# Patient Record
Sex: Male | Born: 1965 | Race: White | Hispanic: No | Marital: Single | Smoking: Current every day smoker
Health system: Southern US, Community
[De-identification: ages and names within clinical notes are randomized; demographics above are authoritative.]

---

## 1998-08-21 ENCOUNTER — Ambulatory Visit (HOSPITAL_COMMUNITY): Admission: RE | Admit: 1998-08-21 | Discharge: 1998-08-21 | Payer: Self-pay | Admitting: Psychiatry

## 1999-08-04 ENCOUNTER — Encounter: Payer: Self-pay | Admitting: Emergency Medicine

## 1999-08-04 ENCOUNTER — Emergency Department (HOSPITAL_COMMUNITY): Admission: EM | Admit: 1999-08-04 | Discharge: 1999-08-04 | Payer: Self-pay

## 2017-04-18 ENCOUNTER — Emergency Department (HOSPITAL_COMMUNITY): Payer: Self-pay

## 2017-04-18 ENCOUNTER — Emergency Department (HOSPITAL_COMMUNITY)
Admission: EM | Admit: 2017-04-18 | Discharge: 2017-04-18 | Disposition: A | Discharge status: Home / Self Care | Payer: Self-pay | Attending: Emergency Medicine | Admitting: Emergency Medicine

## 2017-04-18 ENCOUNTER — Encounter (HOSPITAL_COMMUNITY): Payer: Self-pay

## 2017-04-18 DIAGNOSIS — F10929 Alcohol use, unspecified with intoxication, unspecified: Secondary | ICD-10-CM | POA: Insufficient documentation

## 2017-04-18 DIAGNOSIS — Y999 Unspecified external cause status: Secondary | ICD-10-CM | POA: Insufficient documentation

## 2017-04-18 DIAGNOSIS — S0081XA Abrasion of other part of head, initial encounter: Secondary | ICD-10-CM | POA: Insufficient documentation

## 2017-04-18 DIAGNOSIS — F17210 Nicotine dependence, cigarettes, uncomplicated: Secondary | ICD-10-CM | POA: Insufficient documentation

## 2017-04-18 DIAGNOSIS — S0990XA Unspecified injury of head, initial encounter: Secondary | ICD-10-CM | POA: Insufficient documentation

## 2017-04-18 DIAGNOSIS — F10920 Alcohol use, unspecified with intoxication, uncomplicated: Secondary | ICD-10-CM

## 2017-04-18 DIAGNOSIS — Y939 Activity, unspecified: Secondary | ICD-10-CM | POA: Insufficient documentation

## 2017-04-18 DIAGNOSIS — Y929 Unspecified place or not applicable: Secondary | ICD-10-CM | POA: Insufficient documentation

## 2017-04-18 DIAGNOSIS — W109XXA Fall (on) (from) unspecified stairs and steps, initial encounter: Secondary | ICD-10-CM | POA: Insufficient documentation

## 2017-04-18 LAB — COMPREHENSIVE METABOLIC PANEL
ALT: 72 U/L — ABNORMAL HIGH (ref 17–63)
AST: 73 U/L — AB (ref 15–41)
Albumin: 3.9 g/dL (ref 3.5–5.0)
Alkaline Phosphatase: 49 U/L (ref 38–126)
Anion gap: 15 (ref 5–15)
BUN: 11 mg/dL (ref 6–20)
CHLORIDE: 108 mmol/L (ref 101–111)
CO2: 22 mmol/L (ref 22–32)
Calcium: 8.8 mg/dL — ABNORMAL LOW (ref 8.9–10.3)
Creatinine, Ser: 0.82 mg/dL (ref 0.61–1.24)
GFR calc Af Amer: >60 mL/min (ref >60)
Glucose, Bld: 92 mg/dL (ref 65–99)
POTASSIUM: 3.4 mmol/L — AB (ref 3.5–5.1)
SODIUM: 145 mmol/L (ref 135–145)
Total Bilirubin: 0.5 mg/dL (ref 0.3–1.2)
Total Protein: 6.7 g/dL (ref 6.5–8.1)

## 2017-04-18 LAB — RAPID URINE DRUG SCREEN, HOSP PERFORMED
Amphetamines: NOT DETECTED
BARBITURATES: NOT DETECTED
Benzodiazepines: NOT DETECTED
Cocaine: NOT DETECTED
Opiates: NOT DETECTED
Tetrahydrocannabinol: NOT DETECTED

## 2017-04-18 LAB — CBC
HCT: 38.9 % — ABNORMAL LOW (ref 39.0–52.0)
Hemoglobin: 13.7 g/dL (ref 13.0–17.0)
MCH: 31.4 pg (ref 26.0–34.0)
MCHC: 35.2 g/dL (ref 30.0–36.0)
MCV: 89 fL (ref 78.0–100.0)
PLATELETS: 164 10*3/uL (ref 150–400)
RBC: 4.37 MIL/uL (ref 4.22–5.81)
RDW: 14.4 % (ref 11.5–15.5)
WBC: 7.8 10*3/uL (ref 4.0–10.5)

## 2017-04-18 LAB — ETHANOL: ALCOHOL ETHYL (B): 426 mg/dL — AB (ref <5)

## 2017-04-18 LAB — ACETAMINOPHEN LEVEL: Acetaminophen (Tylenol), Serum: <10 ug/mL — ABNORMAL LOW (ref 10–30)

## 2017-04-18 LAB — SALICYLATE LEVEL

## 2017-04-18 MED ORDER — TETANUS-DIPHTH-ACELL PERTUSSIS 5-2.5-18.5 LF-MCG/0.5 IM SUSP
0.5000 mL | Freq: Once | INTRAMUSCULAR | Status: AC
  Administered 2017-04-18: 0.5 mL via INTRAMUSCULAR
  Filled 2017-04-18: qty 0.5

## 2017-04-18 NOTE — ED Notes (Signed)
Pt ambulated to bathroom without assistance 

## 2017-04-18 NOTE — ED Notes (Signed)
Pt wanded by security. 

## 2017-04-18 NOTE — ED Notes (Signed)
Pt still appears intoxicated, when asked to call his friend for a ride back home, he kept pushing the empty food container saying "this is Charles Bird's number". Pt sts no family available to come pick him up. Breakfast tray provided to pt.

## 2017-04-18 NOTE — Discharge Instructions (Signed)
You may alternate Tylenol 1000 mg every 6 hours as needed for pain and Ibuprofen 800 mg every 8 hours as needed for pain.  Please take Ibuprofen with food. ° ° ° °To find a primary care or specialty doctor please call [phone] or [phone] to access "[place] Find a Doctor Service." ° °You may also go on the [facility] website at [email or web address] ° °There are also multiple [facility], [facility] and [facility] practices throughout the [place] that are frequently accepting new patients. You may find a clinic that is close to your home and contact them. ° °[facility] -  °[address] °[place] [address] °[phone] ° ° °[facility] -  °[address] ° [place] [address] [phone] ° ° °[facility] - °[address]  °[place] [address] °[phone] ° ° °

## 2017-04-18 NOTE — ED Notes (Signed)
Bed: [id number] Expected date:  Expected time:  Means of arrival:  Comments: EMS intoxicated and Suicidal

## 2017-04-18 NOTE — ED Triage Notes (Signed)
Pt asked if he wants to hurt himself, pt state  yes . Pt states that he did not care if he lived or died. Pt does report a current everyday drinker, pt denies any other drug use at this

## 2017-04-18 NOTE — ED Triage Notes (Signed)
Pt brought in by EMS found at a friends home. Pt was at friends home and suddenly became agressive and having alerted mental status. Pt  does reports that he has been drinking. Pt was reported to fallen down two steps over on to a flower pot and sustained small laceration of right eye. Per EMS pt friends report that patient had some bleeding from nose. Pt  Told EMS that he did not care  If he died   BP 170/100 HR 112, RR 20  CBG 119

## 2017-04-18 NOTE — ED Notes (Signed)
Pt Critical ETHOL 426 Dr. Elesa MassedWard made aware

## 2017-04-18 NOTE — ED Provider Notes (Addendum)
TIME SEEN: 3:26 AM  CHIEF COMPLAINT: Alcohol intoxication, head injury  HPI: Patient is a 51 year old male with no significant past medical history who presents to the emergency department after a fall that occurred tonight while intoxicated. Patient reports history "a lot" of beer. He reports he has also been using cocaine and Adderall today. Reports he tripped and fell down a couple of stairs and hit his face. No known loss of consciousness. Friends called EMS because patient was agitated, combative. Reportedly told EMS that he did not want to live anymore. When I ask if he has had any suicidal thoughts he states no. He reports a previous history of suicide attempt. Denies HI or hallucinations. Has no medical complaints at this time. Denies any pain.  ROS: See HPI Constitutional: no fever  Eyes: no drainage  ENT: no runny nose   Cardiovascular:  no chest pain  Resp: no SOB  GI: no vomiting GU: no dysuria Integumentary: no rash  Allergy: no hives  Musculoskeletal: no leg swelling  Neurological: no slurred speech ROS otherwise negative  PAST MEDICAL HISTORY/PAST SURGICAL HISTORY:  No past medical history on file.  MEDICATIONS:  Prior to Admission medications   Not on File    ALLERGIES:  Not on File  SOCIAL HISTORY:  Social History  Substance Use Topics  . Smoking status: Current Every Day Smoker  . Smokeless tobacco: Not on file  . Alcohol use Yes    FAMILY HISTORY: No family history on file.  EXAM: BP 131/81 (BP Location: Right Arm)   Pulse (!) 104   Temp 97.7 F (36.5 C) (Oral)   Resp 20   Ht 5\' 9"  (1.753 m)   Wt 83.9 kg (185 lb)   SpO2 97%   BMI 27.32 kg/m  CONSTITUTIONAL: Alert and oriented and responds appropriately to questions. Well-appearing; well-nourished; GCS 15, Smells of alcohol, appears intoxicated, smiling, laughing, appropriate HEAD: Normocephalic; Abrasion under the right cheek and under the right nose EYES: Conjunctivae clear, PERRL, EOMI ENT:  normal nose; no rhinorrhea; moist mucous membranes; pharynx without lesions noted; no dental injury; no septal hematoma, no epistaxis, no facial tenderness, no trismus, no bony deformity NECK: Supple, no meningismus, no LAD; no midline spinal tenderness, step-off or deformity; trachea midline CARD: RRR; S1 and S2 appreciated; no murmurs, no clicks, no rubs, no gallops RESP: Normal chest excursion without splinting or tachypnea; breath sounds clear and equal bilaterally; no wheezes, no rhonchi, no rales; no hypoxia or respiratory distress CHEST:  chest wall stable, no crepitus or ecchymosis or deformity, nontender to palpation; no flail chest ABD/GI: Normal bowel sounds; non-distended; soft, non-tender, no rebound, no guarding; no ecchymosis or other lesions noted PELVIS:  stable, nontender to palpation BACK:  The back appears normal and is non-tender to palpation, there is no CVA tenderness; no midline spinal tenderness, step-off or deformity EXT: Normal ROM in all joints; non-tender to palpation; no edema; normal capillary refill; no cyanosis, no bony tenderness or bony deformity of patient's extremities, no joint effusion, compartments are soft, extremities are warm and well-perfused, no ecchymosis SKIN: Normal color for age and race; warm NEURO: Moves all extremities equally, reports normal sensation diffusely, cranial nerves II through XII intact, normal speech PSYCH: The patient's mood and manner are appropriate. Grooming and personal hygiene are appropriate. Denies SI, HI or hallucinations.  MEDICAL DECISION MAKING: Patient here with a fall likely mechanical while intoxicated. Has abrasions to the face. Will update his tetanus vaccination. No bony tenderness over the face  and he can open his mouth fully. We'll obtain CT of his head and cervical spine given he is intoxicated. Will obtain screening labs and urine as well. No other sign of trauma on exam. At this time he denies to me SI, HI or  hallucinations. He would need to be reassessed when clinically sober.  ED PROGRESS: Patient's labs are unremarkable other than alcohol of 426 and elevated AST and ALT likely in the setting of alcohol abuse. Urine drug screen is negative.  CT of his head and cervical spine show no acute abnormality.  Patient will be signed out to the oncoming day team to be reassessed when clinically sober to determine if he really has suicidal thoughts or if he needs a TTS evaluation.  I reviewed all nursing notes, vitals, pertinent previous records, EKGs, lab and urine results, imaging (as available).      Ward, Layla MawKristen N, DO 04/18/17 0457    7:20 AM  Pt able to ambulate without difficulty. He denies any pain. He adamantly denies suicidal ideation and contracts for safety. He has a sober driver coming to pick him up. I feel he is safe to be discharged home. Discussed head injury return precautions.   At this time, I do not feel there is any life-threatening condition present. I have reviewed and discussed all results (EKG, imaging, lab, urine as appropriate) and exam findings with patient/family. I have reviewed nursing notes and appropriate previous records.  I feel the patient is safe to be discharged home without further emergent workup and can continue workup as an outpatient as needed. Discussed usual and customary return precautions. Patient/family verbalize understanding and are comfortable with this plan.  Outpatient follow-up has been provided if needed. All questions have been answered.     Ward, Layla MawKristen N, DO 04/18/17 [phone]

## 2018-09-17 IMAGING — CT CT CERVICAL SPINE W/O CM
4 of 8 series · 13 of 33 positions shown, 14 images · non-contrast
Comparison: None.

CLINICAL DATA: Initial evaluation for acute trauma, fall.

EXAM:
CT HEAD WITHOUT CONTRAST
CT CERVICAL SPINE WITHOUT CONTRAST
TECHNIQUE: Multidetector CT imaging of the head and cervical spine was
performed following the standard protocol without intravenous
contrast. Multiplanar CT image reconstructions of the cervical spine
were also generated.

[Series 6: c-spine st · axial · 0.27mm/px · z∈[-278,-186]mm · 3 of 92 slices shown, 4 images]
[im 23/92  soft-tissue]
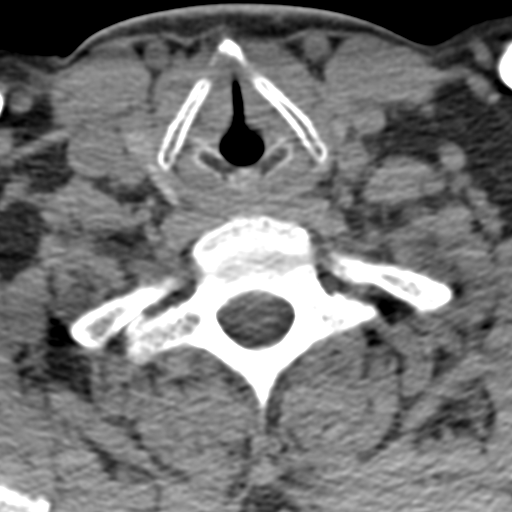
[im 23/92  bone]
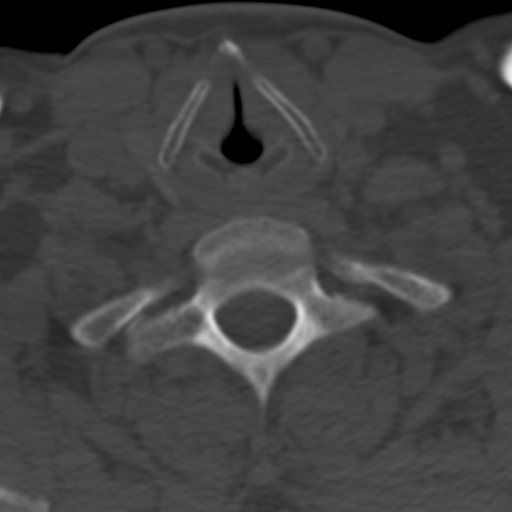
[im 46/92  bone]
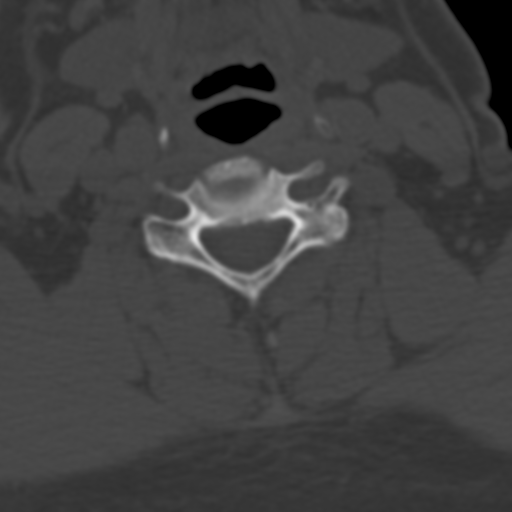
[im 69/92  bone]
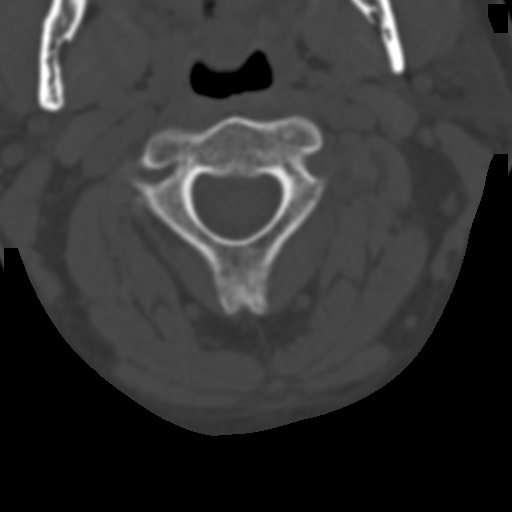

[Series 9: coronal · coronal · 0.30mm/px · 2 of 65 slices shown]
[im 22/65  bone]
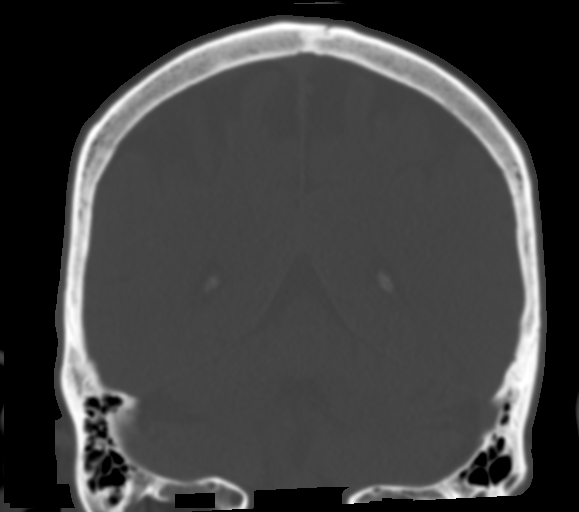
[im 43/65  bone]
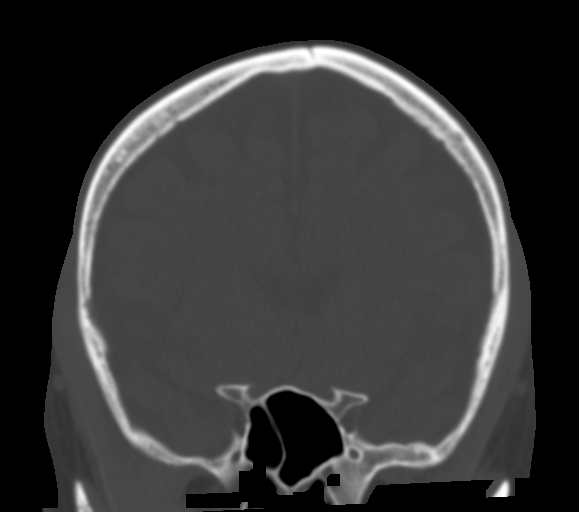

[Series 10: sagittal · sagittal · 0.33mm/px · 5 of 49 slices shown]
[im 7/49  bone]
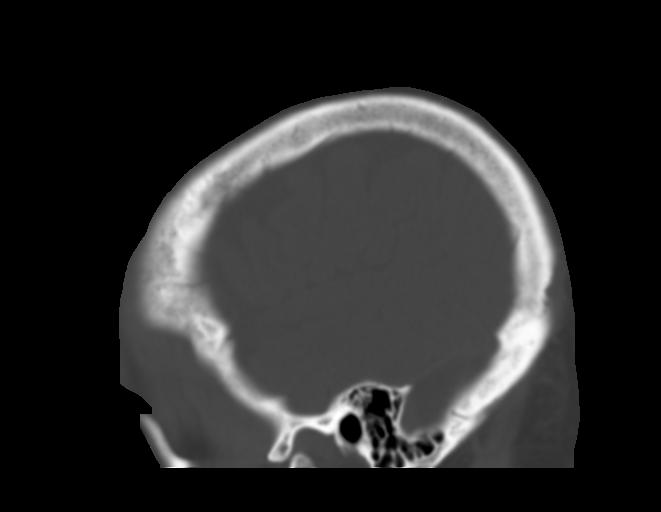
[im 14/49  bone]
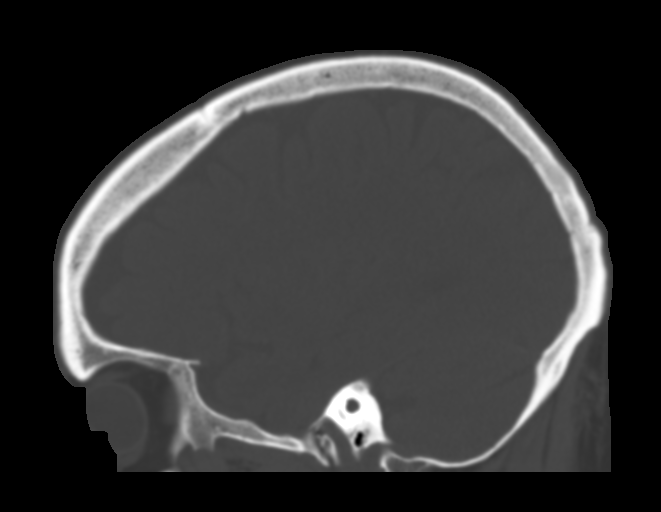
[im 21/49  bone]
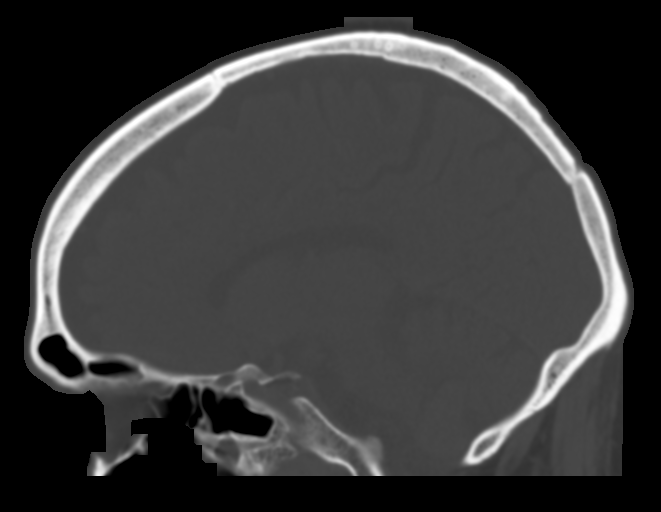
[im 28/49  bone]
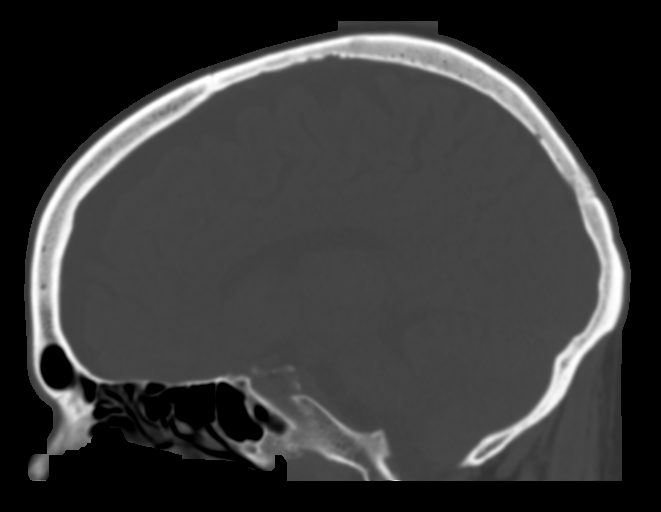
[im 35/49  bone]
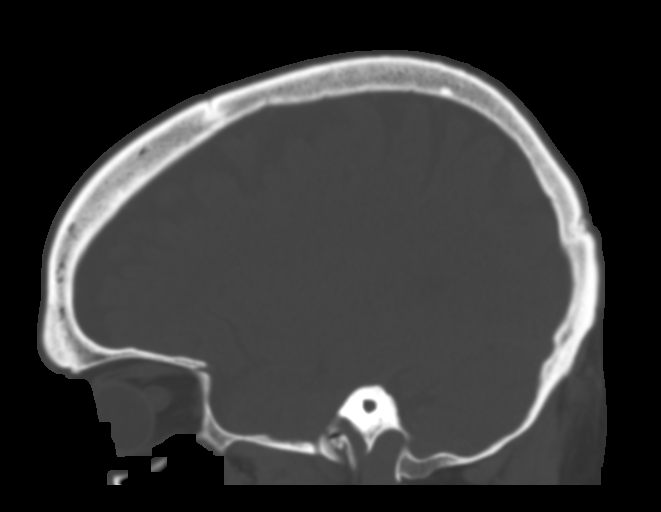

[Series 11: axial recon · axial · 0.23mm/px · z∈[-308,-216]mm · 3 of 99 slices shown]
[im 25/99  bone]
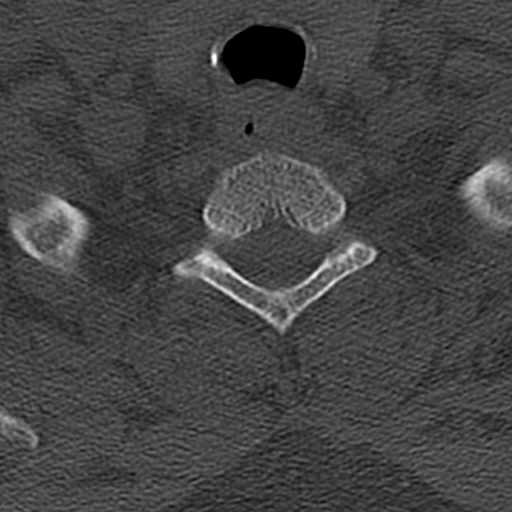
[im 50/99  bone]
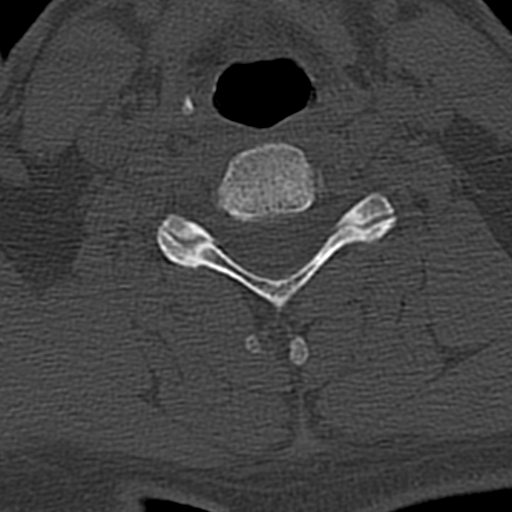
[im 74/99  bone]
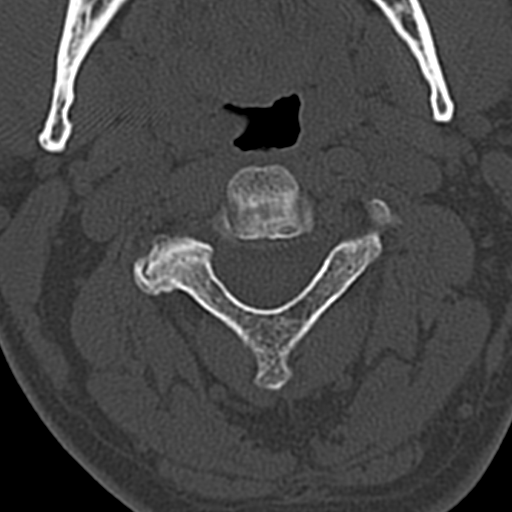

[13 of 33 positions shown; findings below may reference images not displayed]

FINDINGS: CT HEAD FINDINGS

Brain: Mildly advanced cerebral atrophy for age. Chronic small
vessel ischemic disease. No acute intracranial hemorrhage. No
evidence for acute large vessel territory infarct. No mass lesion,
midline shift or mass effect. No hydrocephalus. No extra-axial fluid
collection.

Vascular: No hyperdense vessel. Scattered vascular calcifications
noted within the carotid siphons.

Skull: No appreciable scalp soft tissue injury.  Calvarium intact.

Sinuses/Orbits: Globes oral soft tissues within normal limits.
Paranasal sinuses are largely clear. Visualize mastoids are clear.

CT CERVICAL SPINE FINDINGS

Alignment: Mild straightening of the normal cervical lordosis. No
listhesis.

Skull base and vertebrae: Skullbase intact. Normal C1-2
articulations are preserved. Dens is intact. Vertebral body heights
maintained. No acute fracture.

Soft tissues and spinal canal: Visualized soft tissues of the neck
demonstrate no acute abnormality. No prevertebral edema. Spinal
canal within normal limits. Tiny subcentimeter hypodense nodule
noted within left lobe of thyroid, of doubtful significance.

Disc levels: Mild degenerate spondylolysis noted at C4-5. Mild
multilevel facet arthrosis noted, most notable at C2-3 on the right.

Upper chest: Visualized upper chest within normal limits. Visualized
lung apices are clear. No apical pneumothorax.
IMPRESSION: CT BRAIN:

1. No acute intracranial process.
2. Generalized cerebral atrophy with mild chronic small vessel
ischemic disease.

CT CERVICAL SPINE:

1. No acute traumatic injury within the cervical spine.
2. Degenerative spondylolysis at C4-5.

## 2021-08-15 DEATH — deceased
